# Patient Record
Sex: Male | Born: 1965 | Race: White | Hispanic: No | Marital: Married | Smoking: Never smoker
Health system: Southern US, Community
[De-identification: ages and names within clinical notes are randomized; demographics above are authoritative.]

## PROBLEM LIST (undated history)

## (undated) DIAGNOSIS — I10 Essential (primary) hypertension: Secondary | ICD-10-CM

## (undated) HISTORY — PX: APPENDECTOMY: SHX54

---

## 2007-02-23 ENCOUNTER — Encounter: Admission: RE | Admit: 2007-02-23 | Discharge: 2007-02-23 | Payer: Self-pay | Admitting: Orthopedic Surgery

## 2007-03-07 ENCOUNTER — Encounter: Admission: RE | Admit: 2007-03-07 | Discharge: 2007-03-07 | Payer: Self-pay | Admitting: Orthopedic Surgery

## 2008-09-20 IMAGING — CR DG ELBOW COMPLETE 3+V*L*
4 series · 4 of 4 positions shown · non-contrast
Comparison: none

CLINICAL DATA: Lateral left elbow pain.
 LEFT ELBOW ? FOUR VIEWS:

[view not recorded (1 of 4)]
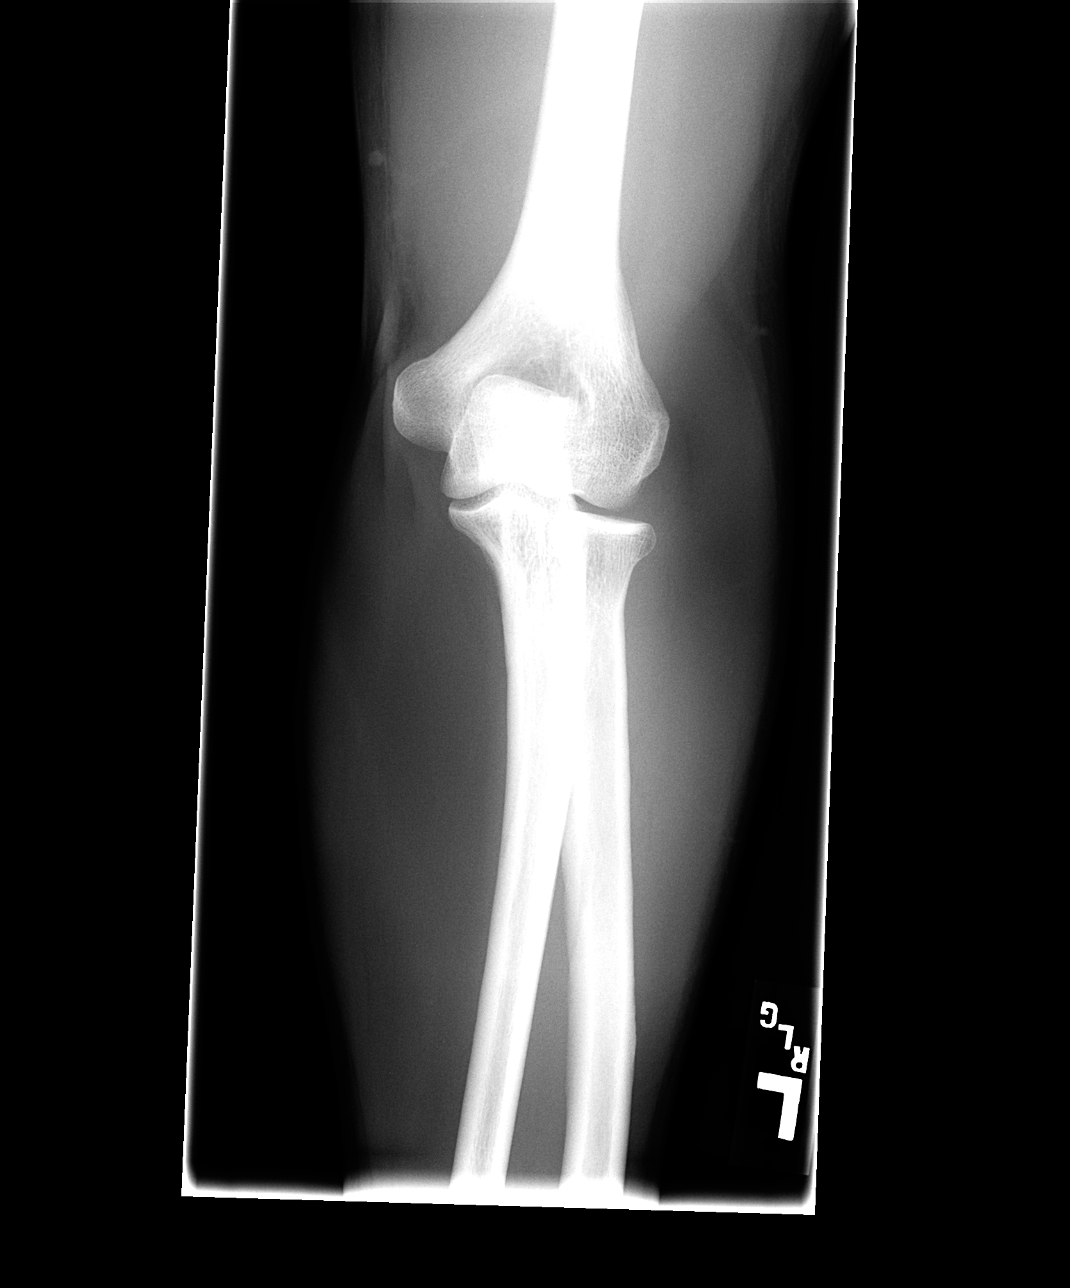

[view not recorded (2 of 4)]
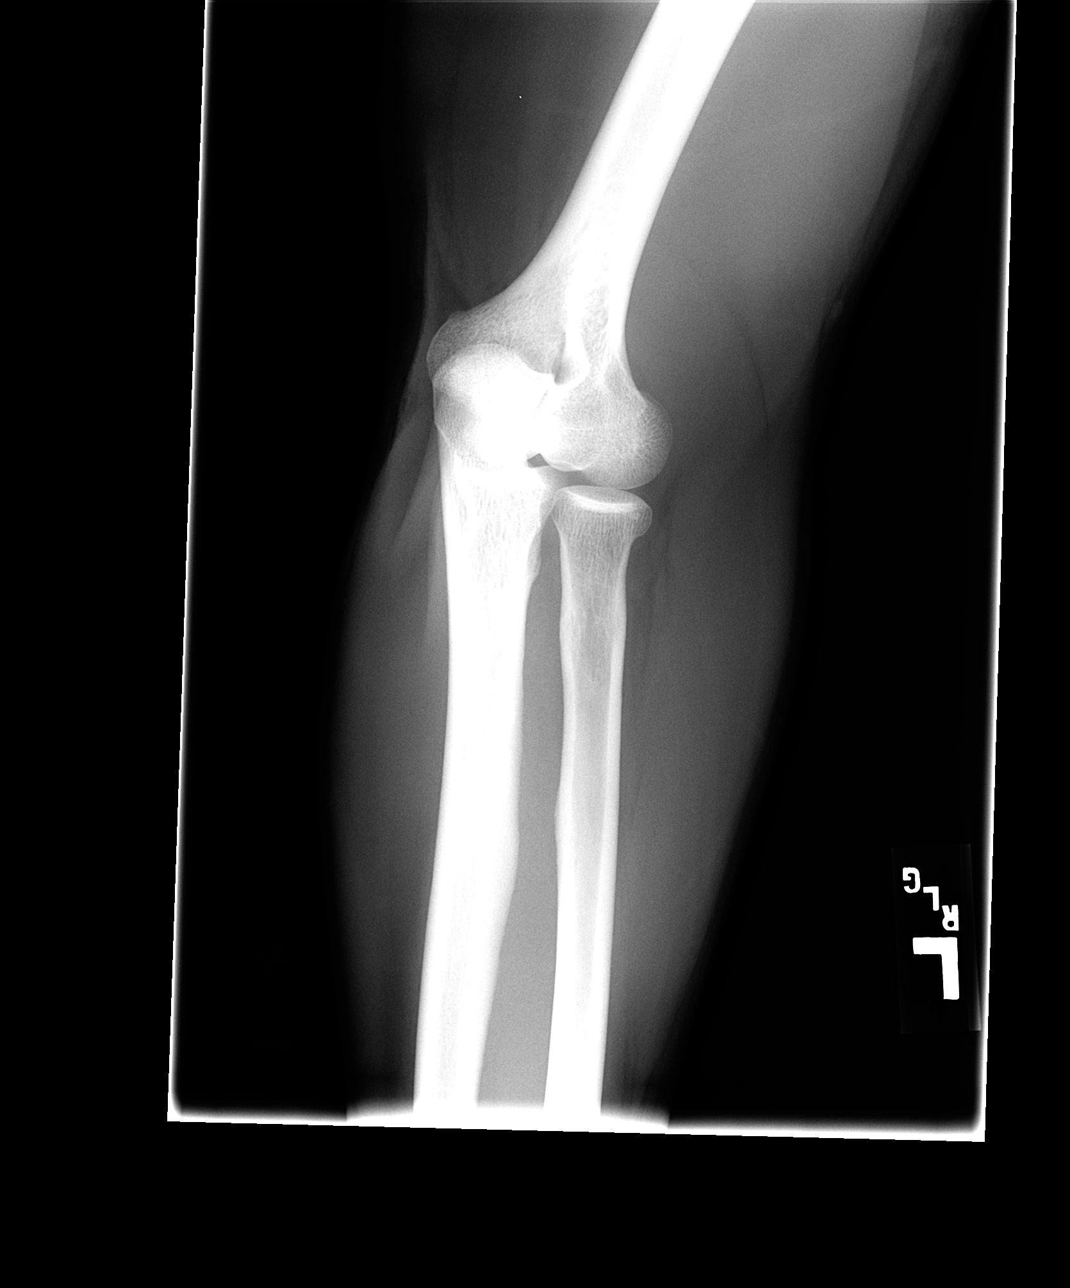

[view not recorded (3 of 4)]
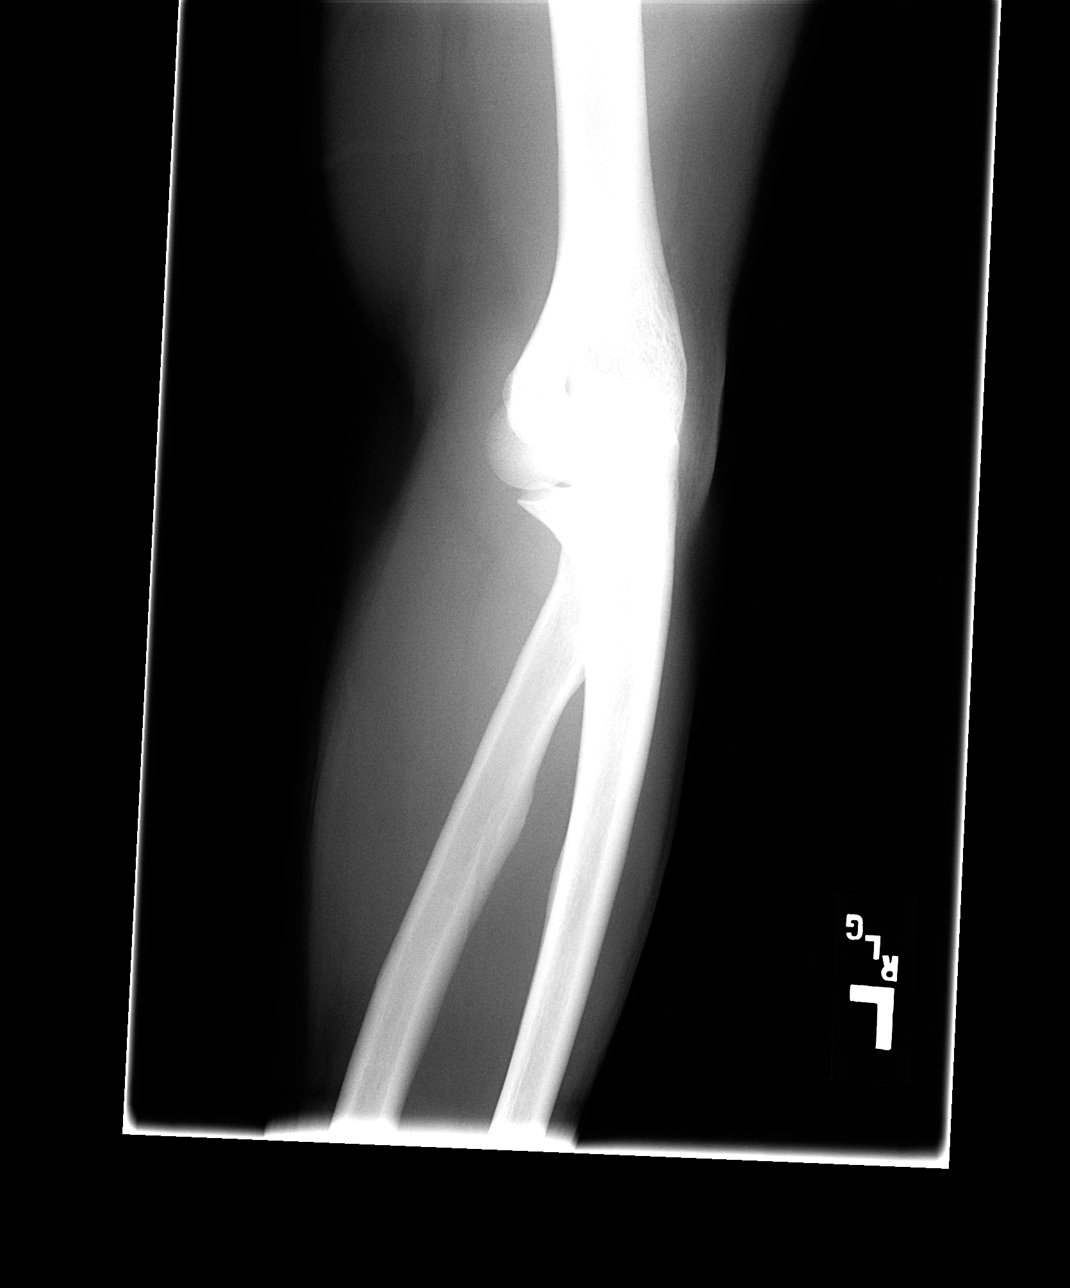

[view not recorded (4 of 4)]
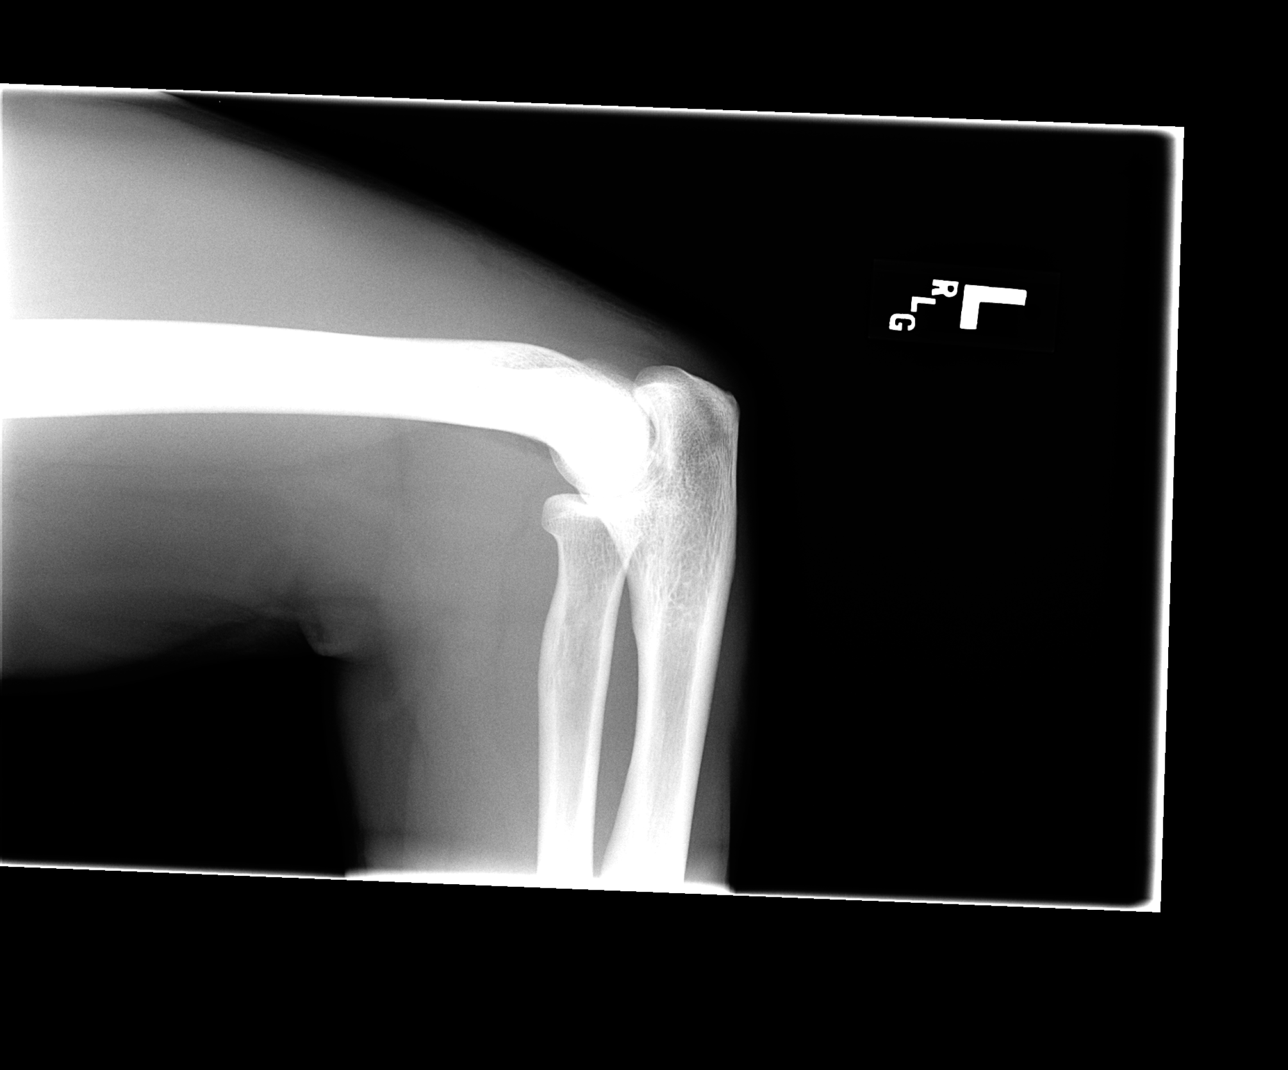

[4 of 4 positions shown; findings below may reference images not displayed]

FINDINGS: No evidence of joint effusion.  No evidence of arthritis or other focal lesion.
IMPRESSION: Normal radiographs.

## 2013-02-15 ENCOUNTER — Other Ambulatory Visit: Payer: Self-pay | Admitting: Orthopedic Surgery

## 2013-02-15 ENCOUNTER — Ambulatory Visit (INDEPENDENT_AMBULATORY_CARE_PROVIDER_SITE_OTHER): Payer: BC Managed Care – PPO

## 2013-02-15 DIAGNOSIS — IMO0001 Reserved for inherently not codable concepts without codable children: Secondary | ICD-10-CM

## 2013-02-15 DIAGNOSIS — M20019 Mallet finger of unspecified finger(s): Secondary | ICD-10-CM

## 2013-02-19 ENCOUNTER — Ambulatory Visit
Admission: RE | Admit: 2013-02-19 | Discharge: 2013-02-19 | Disposition: A | Discharge status: Home / Self Care | Payer: BC Managed Care – PPO | Source: Ambulatory Visit | Attending: Unknown Physician Specialty | Admitting: Unknown Physician Specialty

## 2013-02-19 ENCOUNTER — Other Ambulatory Visit: Payer: Self-pay | Admitting: Unknown Physician Specialty

## 2013-02-19 DIAGNOSIS — R599 Enlarged lymph nodes, unspecified: Secondary | ICD-10-CM

## 2013-02-19 MED ORDER — IOHEXOL 300 MG/ML  SOLN
75.0000 mL | Freq: Once | INTRAMUSCULAR | Status: AC | PRN
  Administered 2013-02-19: 75 mL via INTRAVENOUS

## 2016-01-19 ENCOUNTER — Emergency Department (HOSPITAL_BASED_OUTPATIENT_CLINIC_OR_DEPARTMENT_OTHER)
Admission: EM | Admit: 2016-01-19 | Discharge: 2016-01-20 | Disposition: A | Discharge status: Home / Self Care | Payer: BLUE CROSS/BLUE SHIELD | Attending: Emergency Medicine | Admitting: Emergency Medicine

## 2016-01-19 ENCOUNTER — Encounter (HOSPITAL_BASED_OUTPATIENT_CLINIC_OR_DEPARTMENT_OTHER): Payer: Self-pay | Admitting: Emergency Medicine

## 2016-01-19 ENCOUNTER — Emergency Department (HOSPITAL_BASED_OUTPATIENT_CLINIC_OR_DEPARTMENT_OTHER): Payer: BLUE CROSS/BLUE SHIELD

## 2016-01-19 DIAGNOSIS — W540XXA Bitten by dog, initial encounter: Secondary | ICD-10-CM | POA: Diagnosis not present

## 2016-01-19 DIAGNOSIS — S60411A Abrasion of left index finger, initial encounter: Secondary | ICD-10-CM | POA: Insufficient documentation

## 2016-01-19 DIAGNOSIS — S61452A Open bite of left hand, initial encounter: Secondary | ICD-10-CM

## 2016-01-19 DIAGNOSIS — T07XXXA Unspecified multiple injuries, initial encounter: Secondary | ICD-10-CM

## 2016-01-19 DIAGNOSIS — Y999 Unspecified external cause status: Secondary | ICD-10-CM | POA: Diagnosis not present

## 2016-01-19 DIAGNOSIS — Z79899 Other long term (current) drug therapy: Secondary | ICD-10-CM | POA: Diagnosis not present

## 2016-01-19 DIAGNOSIS — S61132A Puncture wound without foreign body of left thumb with damage to nail, initial encounter: Secondary | ICD-10-CM | POA: Diagnosis not present

## 2016-01-19 DIAGNOSIS — Z23 Encounter for immunization: Secondary | ICD-10-CM | POA: Insufficient documentation

## 2016-01-19 DIAGNOSIS — Y9389 Activity, other specified: Secondary | ICD-10-CM | POA: Diagnosis not present

## 2016-01-19 DIAGNOSIS — Y929 Unspecified place or not applicable: Secondary | ICD-10-CM | POA: Insufficient documentation

## 2016-01-19 DIAGNOSIS — I10 Essential (primary) hypertension: Secondary | ICD-10-CM | POA: Insufficient documentation

## 2016-01-19 DIAGNOSIS — S60413A Abrasion of left middle finger, initial encounter: Secondary | ICD-10-CM | POA: Diagnosis not present

## 2016-01-19 DIAGNOSIS — S61152A Open bite of left thumb with damage to nail, initial encounter: Secondary | ICD-10-CM | POA: Diagnosis present

## 2016-01-19 HISTORY — DX: Essential (primary) hypertension: I10

## 2016-01-19 MED ORDER — HYDROCODONE-ACETAMINOPHEN 5-325 MG PO TABS
1.0000 | ORAL_TABLET | Freq: Once | ORAL | Status: AC
  Administered 2016-01-19: 1 via ORAL
  Filled 2016-01-19: qty 1

## 2016-01-19 MED ORDER — LIDOCAINE HCL 2 % IJ SOLN
10.0000 mL | Freq: Once | INTRAMUSCULAR | Status: AC
  Administered 2016-01-19: 10 mg via INTRADERMAL
  Filled 2016-01-19: qty 20

## 2016-01-19 MED ORDER — SODIUM CHLORIDE 0.9 % IV SOLN
3.0000 g | Freq: Once | INTRAVENOUS | Status: AC
  Administered 2016-01-20: 3 g via INTRAVENOUS
  Filled 2016-01-19: qty 3

## 2016-01-19 MED ORDER — AMOXICILLIN-POT CLAVULANATE 875-125 MG PO TABS: 1.0000 | ORAL_TABLET | Freq: Once | ORAL | Status: DC

## 2016-01-19 MED ORDER — TETANUS-DIPHTH-ACELL PERTUSSIS 5-2.5-18.5 LF-MCG/0.5 IM SUSP
0.5000 mL | Freq: Once | INTRAMUSCULAR | Status: AC
  Administered 2016-01-20: 0.5 mL via INTRAMUSCULAR
  Filled 2016-01-19: qty 0.5

## 2016-01-19 NOTE — ED Triage Notes (Signed)
Patient reports that he was trying to break up his dog with another dog and the other dog bit his left hand and fingers. The patient reports that the dog is up to date on its shots

## 2016-01-19 NOTE — ED Provider Notes (Signed)
[facility] Note   CSN: [id number] Arrival date & time: 01/19/16  2300  First Provider Contact:   First MD Initiated Contact with Patient 01/19/16 2335     By signing my name below, I, Rosario Adie, attest that this documentation has been prepared under the direction and in the presence of Fayrene Helper, PA-C.  Electronically Signed: Rosario Adie, ED Scribe. 01/19/16. 11:44 PM.  History   Chief Complaint Chief Complaint  Patient presents with  . Animal Bite   The history is provided by the patient. No language interpreter was used.   HPI Comments: Todd Graham is a 50 y.o. male with a PMHx of HTN, who is right hand dominant, who presents to the Emergency Department complaining of sudden onset, gradually worsening, constant, moderate, throbbing left hand pain s/p dog bite that occurred ~3-4 hours PTA, worse in his left first digit. Pt reports associated, less severe, right hand pain sustained during the incident as well. Bleeding is controlled at both sites. The attacking dog was on a leash with it's owner (Pt's neighbor), and began attacking the pt's dog. Per pt, he reached down to break up the fight and pick up his own dog when his neighbor's dog began attacking him. He describes the dog as a white, and medium-sized. The biting dog is UTD with vaccinations. NKDA. Pt applied peroxide prior to coming into the ED to clean his wounds, and applied pressure dressings. His pain is worsened with movement to his digits. Denies numbness, or any other associated symptoms. Tetanus is not UTD.   Past Medical History:  Diagnosis Date  . Hypertension    There are no active problems to display for this patient.  Past Surgical History:  Procedure Laterality Date  . APPENDECTOMY      Home Medications    Prior to Admission medications   Medication Sig Start Date End Date Taking? Authorizing Provider  lisinopril-hydrochlorothiazide (PRINZIDE,ZESTORETIC) 20-25 MG  tablet Take 1 tablet by mouth daily.   Yes Historical Provider, MD   Family History History reviewed. No pertinent family history.  Social History Social History  Substance Use Topics  . Smoking status: Never Smoker  . Smokeless tobacco: Never Used  . Alcohol use Yes     Comment: occ   Allergies   Review of patient's allergies indicates no known allergies.  Review of Systems Review of Systems  Musculoskeletal: Positive for myalgias.  Skin: Positive for wound.  Neurological: Negative for numbness.  All other systems reviewed and are negative.  Physical Exam Updated Vital Signs BP 126/92 (BP Location: Right Arm)   Pulse 84   Temp 98.5 F (36.9 C) (Oral)   Resp 18   Ht  (1.753 m)   Wt 200 lb (90.7 kg)   SpO2 100%   BMI 29.53 kg/m   Physical Exam  Constitutional: He appears well-developed and well-nourished.  HENT:  Head: Normocephalic.  Eyes: Conjunctivae are normal.  Cardiovascular: Normal rate.   Pulmonary/Chest: Effort normal. No respiratory distress.  Abdominal: He exhibits no distension.  Musculoskeletal: Normal range of motion.  Left hand:  Left thumb: Puncture wound noted to the IP joint on both dorsal and ventral aspects w/ a 15% subungual hematoma to the nail bed proximally. Thumb with full ROM w/o gross deformity. Abrasion noted to the 2nd and 3rd MCP region, w/o joint involvement.   Right hand: Small abrasions noted to the dorsum of right hand w/o any active bleeding.   Neurological: He is  alert.  Skin: Skin is warm and dry.  Psychiatric: He has a normal mood and affect. His behavior is normal.  Nursing note and vitals reviewed.  ED Treatments / Results  DIAGNOSTIC STUDIES: Oxygen Saturation is 100% on RA, normal by my interpretation.   COORDINATION OF CARE: 11:42 PM-Discussed next steps with pt. Pt verbalized understanding and is agreeable with the plan.   Radiology Dg Finger Thumb Left  Result Date: 01/20/2016 CLINICAL DATA:  50 year old  male with noncompliant and laceration of the thumb. EXAM: LEFT THUMB 2+V COMPARISON:  None. FINDINGS: There is no acute fracture or dislocation. The bones are well mineralized. No arthritic changes. There is skin laceration over the thumb. No radiopaque foreign object or soft tissue gas. IMPRESSION: No acute/traumatic osseous pathology. Electronically Signed   By: Elgie Collard M.D.   On: 01/20/2016 00:27    Procedures Procedures (including critical care time)  Medications Ordered in ED Medications  HYDROcodone-acetaminophen (NORCO/VICODIN) 5-325 MG per tablet 1 tablet (not administered)  lidocaine (XYLOCAINE) 2 % (with pres) injection 200 mg (not administered)  Tdap (BOOSTRIX) injection 0.5 mL (not administered)  amoxicillin-clavulanate (AUGMENTIN) 875-125 MG per tablet 1 tablet (not administered)   Initial Impression / Assessment and Plan / ED Course  I have reviewed the triage vital signs and the nursing notes.  Pertinent labs & imaging results that were available during my care of the patient were reviewed by me and considered in my medical decision making (see chart for details).  Clinical Course   LACERATION REPAIR Performed by: Fayrene Helper Authorized byFayrene Helper Consent: Verbal consent obtained. Risks and benefits: risks, benefits and alternatives were discussed Consent given by: patient Patient identity confirmed: provided demographic data Prepped and Draped in normal sterile fashion Wound explored  Laceration Location: L thumb, pad of finger, and overlying IP region dorsally.   Laceration Length: 1cm  No Foreign Bodies seen or palpated  Anesthesia: digital nerve block  Local anesthetic: lidocaine 2% w/o epinephrine  Anesthetic total: 5 ml  Irrigation method: syringe Amount of cleaning: standard  Skin closure: secondary intention  Number of sutures: none3  Technique: thorough irrigation and nonocclusive dressing with coban  Patient tolerance: Patient  tolerated the procedure well with no immediate complications.   BP 126/92 (BP Location: Right Arm)   Pulse 84   Temp 98.5 F (36.9 C) (Oral)   Resp 18   Ht 5\' 9"  (1.753 m)   Wt 90.7 kg   SpO2 100%   BMI 29.53 kg/m   Pt presents to the ED s/p dog bite to bilateral hands.  Patient X-Ray negative for obvious fracture or dislocation. Pain managed in ED. Unasyn IV abx given.  L thumb puncture wound was irrigated thoroughly.  No obvious joint involvement.  Pt advised to follow up with hand speclialist if symptoms persist for possibility of missed fracture diagnosis or joint involvement. Patient given wound care and conservative therapy recommended and discussed. Pt sent home w/ an rx course of Augmentin, and IV Unasyn while in the ED and advised to f/u for new or worsening symptoms. Patient stable for dc home & is agreeable with above plan.  Pt reminded to check wound daily for signs of infection and to change dressing appropriately    Final Clinical Impressions(s) / ED Diagnoses   Final diagnoses:  Dog bite of left hand, initial encounter  Abrasions of multiple sites   New Prescriptions New Prescriptions   AMOXICILLIN-CLAVULANATE (AUGMENTIN) 875-125 MG TABLET    Take 1 tablet by  mouth 2 (two) times daily. One po bid x 7 days   TRAMADOL (ULTRAM) 50 MG TABLET    Take 1 tablet (50 mg total) by mouth every 6 (six) hours as needed.   I personally performed the services described in this documentation, which was scribed in my presence. The recorded information has been reviewed and is accurate.       Fayrene Helper, PA-C 01/20/16 1610    Paula Libra, MD 01/20/16 [phone]

## 2016-01-20 MED ORDER — TRAMADOL HCL 50 MG PO TABS: 50.0000 mg | ORAL_TABLET | Freq: Four times a day (QID) | ORAL | 0 refills | Status: AC | PRN

## 2016-01-20 MED ORDER — AMOXICILLIN-POT CLAVULANATE 875-125 MG PO TABS: 1.0000 | ORAL_TABLET | Freq: Two times a day (BID) | ORAL | 0 refills | Status: AC

## 2016-01-20 NOTE — Discharge Instructions (Signed)
Please cleanse wound daily and monitor for signs of infection.  Follow up with hand specialist as needed if worsening pain.  Take antibiotic as prescribed for the full duration.

## 2017-08-16 IMAGING — CR DG FINGER THUMB 2+V*L*
3 series · 3 of 3 positions shown · non-contrast
Comparison: None.

CLINICAL DATA: 50-year-old male with noncompliant and laceration of
the thumb.

EXAM:
LEFT THUMB 2+V

[x finger pa left]
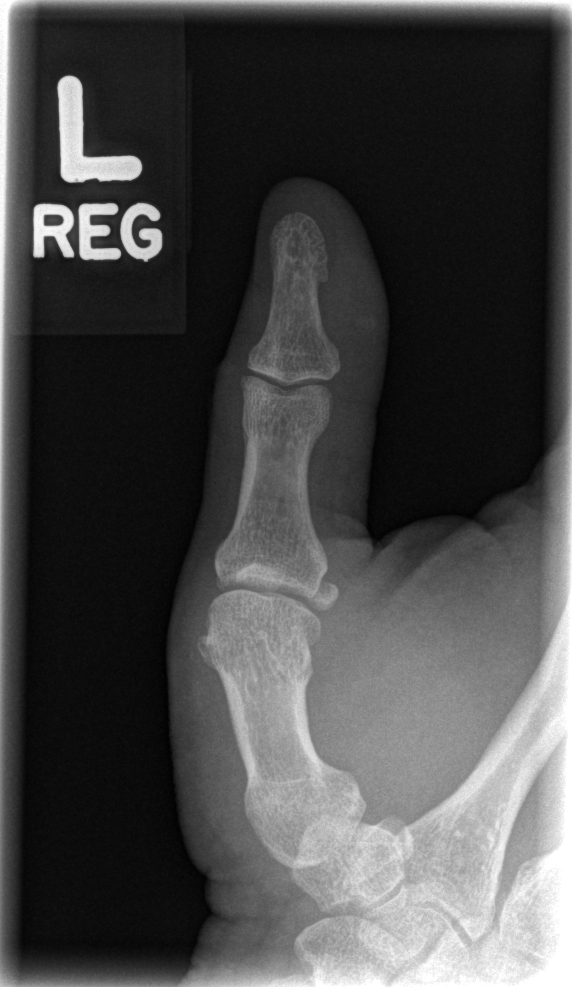

[x finger obl. left]
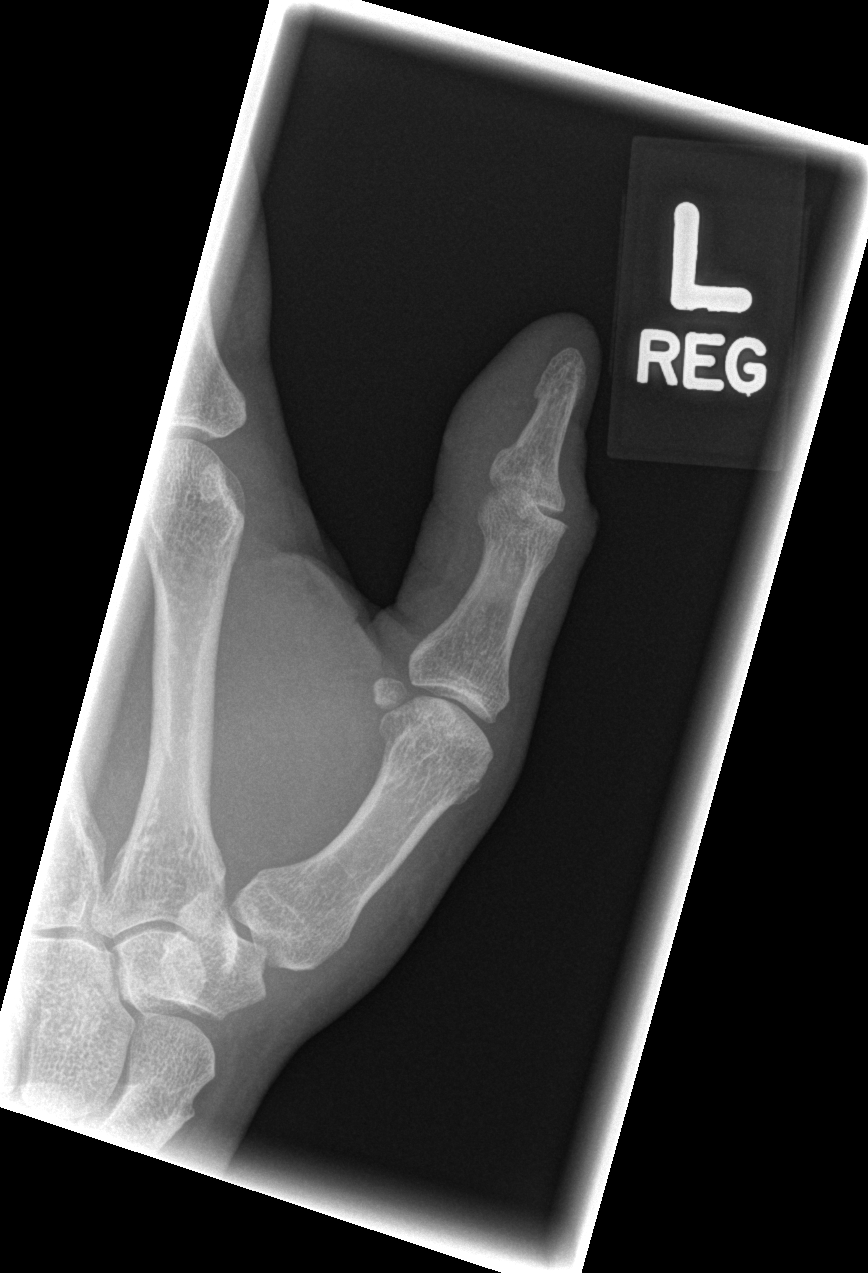

[x finger lateral left]
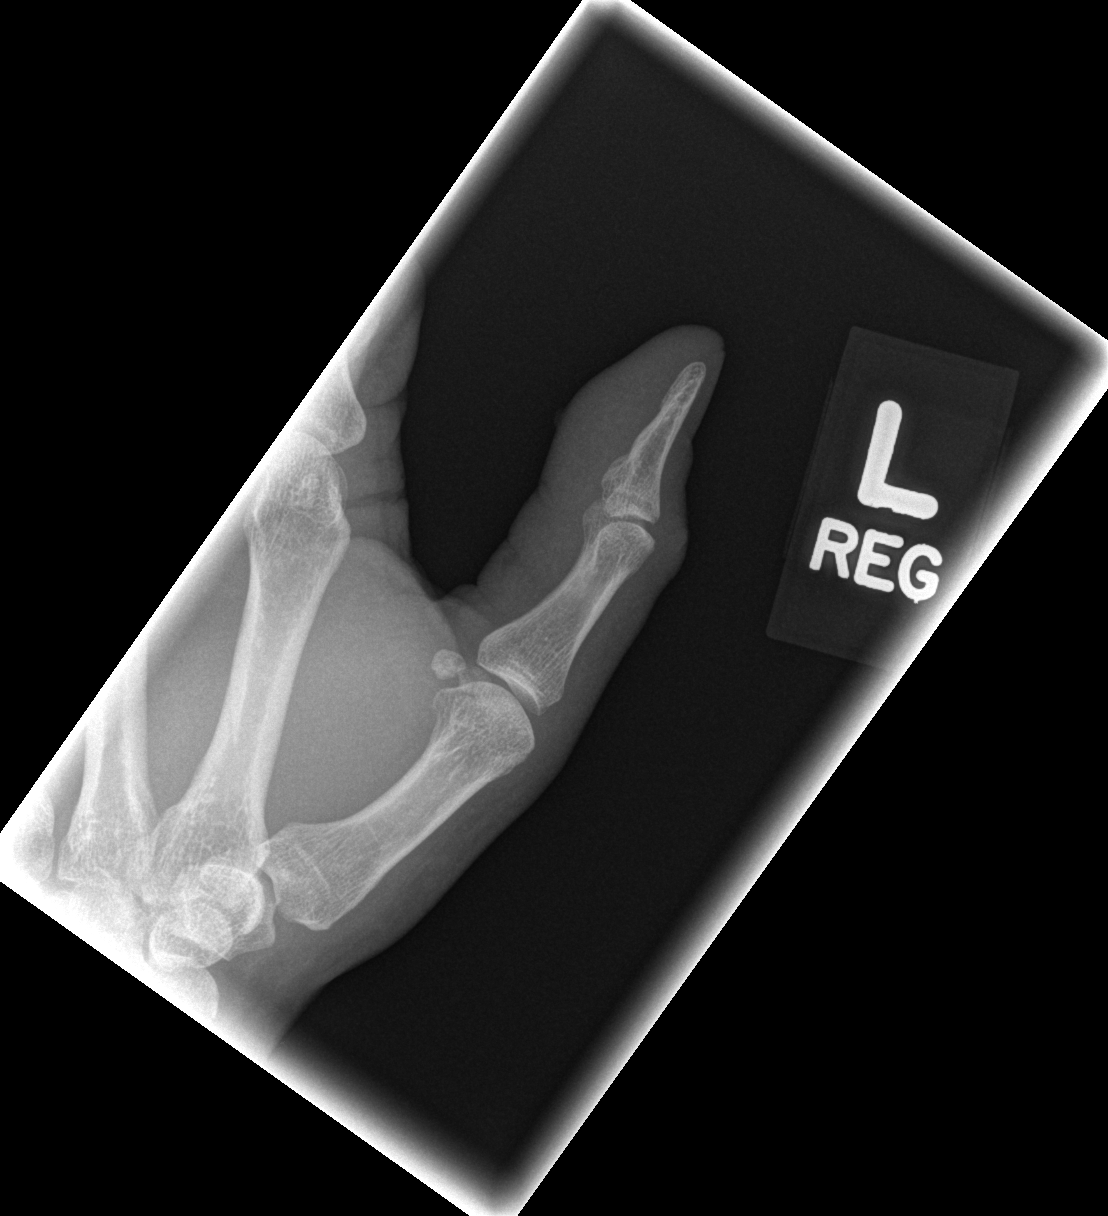

[3 of 3 positions shown; findings below may reference images not displayed]

FINDINGS: There is no acute fracture or dislocation. The bones are well
mineralized. No arthritic changes. There is skin laceration over the
thumb. No radiopaque foreign object or soft tissue gas.
IMPRESSION: No acute/traumatic osseous pathology.
